# Patient Record
Sex: Male | Born: 1990 | Race: Black or African American | Hispanic: No | Marital: Single | Smoking: Current every day smoker
Health system: Southern US, Community
[De-identification: ages and names within clinical notes are randomized; demographics above are authoritative.]

## PROBLEM LIST (undated history)

## (undated) DIAGNOSIS — K219 Gastro-esophageal reflux disease without esophagitis: Secondary | ICD-10-CM

## (undated) DIAGNOSIS — F79 Unspecified intellectual disabilities: Secondary | ICD-10-CM

## (undated) DIAGNOSIS — E7800 Pure hypercholesterolemia, unspecified: Secondary | ICD-10-CM

## (undated) DIAGNOSIS — E785 Hyperlipidemia, unspecified: Secondary | ICD-10-CM

## (undated) DIAGNOSIS — M1990 Unspecified osteoarthritis, unspecified site: Secondary | ICD-10-CM

## (undated) HISTORY — DX: Unspecified intellectual disabilities: F79

---

## 2013-08-22 ENCOUNTER — Emergency Department (HOSPITAL_BASED_OUTPATIENT_CLINIC_OR_DEPARTMENT_OTHER)
Admission: EM | Admit: 2013-08-22 | Discharge: 2013-08-22 | Disposition: A | Discharge status: Home / Self Care | Payer: Medicaid Other | Attending: Emergency Medicine | Admitting: Emergency Medicine

## 2013-08-22 ENCOUNTER — Encounter (HOSPITAL_BASED_OUTPATIENT_CLINIC_OR_DEPARTMENT_OTHER): Payer: Self-pay | Admitting: Emergency Medicine

## 2013-08-22 DIAGNOSIS — J40 Bronchitis, not specified as acute or chronic: Secondary | ICD-10-CM | POA: Insufficient documentation

## 2013-08-22 DIAGNOSIS — K2100 Gastro-esophageal reflux disease with esophagitis, without bleeding: Secondary | ICD-10-CM | POA: Insufficient documentation

## 2013-08-22 DIAGNOSIS — E7800 Pure hypercholesterolemia, unspecified: Secondary | ICD-10-CM | POA: Insufficient documentation

## 2013-08-22 DIAGNOSIS — Z79899 Other long term (current) drug therapy: Secondary | ICD-10-CM | POA: Insufficient documentation

## 2013-08-22 DIAGNOSIS — F17200 Nicotine dependence, unspecified, uncomplicated: Secondary | ICD-10-CM | POA: Insufficient documentation

## 2013-08-22 DIAGNOSIS — R0789 Other chest pain: Secondary | ICD-10-CM | POA: Insufficient documentation

## 2013-08-22 DIAGNOSIS — F411 Generalized anxiety disorder: Secondary | ICD-10-CM | POA: Insufficient documentation

## 2013-08-22 DIAGNOSIS — R079 Chest pain, unspecified: Secondary | ICD-10-CM

## 2013-08-22 HISTORY — DX: Pure hypercholesterolemia, unspecified: E78.00

## 2013-08-22 LAB — BASIC METABOLIC PANEL
BUN: 5 mg/dL — ABNORMAL LOW (ref 6–23)
CALCIUM: 9.8 mg/dL (ref 8.4–10.5)
CHLORIDE: 103 meq/L (ref 96–112)
CO2: 26 meq/L (ref 19–32)
Creatinine, Ser: 0.6 mg/dL (ref 0.50–1.35)
GFR calc Af Amer: >90 mL/min (ref >90)
GFR calc non Af Amer: >90 mL/min (ref >90)
GLUCOSE: 103 mg/dL — AB (ref 70–99)
Potassium: 4.2 mEq/L (ref 3.7–5.3)
SODIUM: 140 meq/L (ref 137–147)

## 2013-08-22 LAB — TROPONIN I: Troponin I: <0.3 ng/mL (ref <0.30)

## 2013-08-22 MED ORDER — OMEPRAZOLE 20 MG PO CPDR: 20.0000 mg | DELAYED_RELEASE_CAPSULE | Freq: Every day | ORAL | Status: DC

## 2013-08-22 NOTE — ED Notes (Signed)
Chest pain x 1 week. He was seen while in [place] last week and started on unknown medication for a lung infection. Finished his medication 3 days ago.

## 2013-08-22 NOTE — Discharge Instructions (Signed)
Bronchitis Bronchitis is swelling (inflammation) of the air tubes leading to your lungs (bronchi). This causes mucus and a cough. If the swelling gets bad, you may have trouble breathing. HOME CARE   Rest. Chest Pain (Nonspecific) It is often hard to give a specific diagnosis for the cause of chest pain. There is always a chance that your pain could be related to something serious, such as a heart attack or a blood clot in the lungs. You need to follow up with your caregiver for further evaluation. CAUSES  Heartburn. Pneumonia or bronchitis. Anxiety or stress. Inflammation around your heart (pericarditis) or lung (pleuritis or pleurisy). A blood clot in the lung. A collapsed lung (pneumothorax). It can develop suddenly on its own (spontaneous pneumothorax) or from injury (trauma) to the chest. Shingles infection (herpes zoster virus). The chest wall is composed of bones, muscles, and cartilage. Any of these can be the source of the pain. The bones can be bruised by injury. The muscles or cartilage can be strained by coughing or overwork. The cartilage can be affected by inflammation and become sore (costochondritis). DIAGNOSIS  Lab tests or other studies, such as X-rays, electrocardiography, stress testing, or cardiac imaging, may be needed to find the cause of your pain.  TREATMENT  Treatment depends on what may be causing your chest pain. Treatment may include: Acid blockers for heartburn. Anti-inflammatory medicine. Pain medicine for inflammatory conditions. Antibiotics if an infection is present. You may be advised to change lifestyle habits. This includes stopping smoking and avoiding alcohol, caffeine, and chocolate. You may be advised to keep your head raised (elevated) when sleeping. This reduces the chance of acid going backward from your stomach into your esophagus. Most of the time, nonspecific chest pain will improve within 2 to 3 days with rest and mild pain medicine. HOME  CARE INSTRUCTIONS  If antibiotics were prescribed, take your antibiotics as directed. Finish them even if you start to feel better. For the next few days, avoid physical activities that bring on chest pain. Continue physical activities as directed. Do not smoke. Avoid drinking alcohol. Only take over-the-counter or prescription medicine for pain, discomfort, or fever as directed by your caregiver. Follow your caregiver's suggestions for further testing if your chest pain does not go away. Keep any follow-up appointments you made. If you do not go to an appointment, you could develop lasting (chronic) problems with pain. If there is any problem keeping an appointment, you must call to reschedule. SEEK MEDICAL CARE IF:  You think you are having problems from the medicine you are taking. Read your medicine instructions carefully. Your chest pain does not go away, even after treatment. You develop a rash with blisters on your chest. SEEK IMMEDIATE MEDICAL CARE IF:  You have increased chest pain or pain that spreads to your arm, neck, jaw, back, or abdomen. You develop shortness of breath, an increasing cough, or you are coughing up blood. You have severe back or abdominal pain, feel nauseous, or vomit. You develop severe weakness, fainting, or chills. You have a fever. THIS IS AN EMERGENCY. Do not wait to see if the pain will go away. Get medical help at once. Call your local emergency services (911 in [place] Do not drive yourself to the hospital. MAKE SURE YOU:  Understand these instructions. Will watch your condition. Will get help right away if you are not doing well or get worse. Document Released: 04/16/2005 Document Revised: 09/29/2011 Document Reviewed: 02/10/2008 [facility] Patient Information 2014 [place], [place].  Diet for Gastroesophageal Reflux Disease, Adult Reflux is when stomach acid flows up into the esophagus. The esophagus becomes irritated and sore (inflammation). When reflux  happens often and is severe, it is called gastroesophageal reflux disease (GERD). What you eat can help ease any discomfort caused by GERD. FOODS OR DRINKS TO AVOID OR LIMIT Coffee and black tea, with or without caffeine. Bubbly (carbonated) drinks with caffeine or energy drinks. Strong spices, such as pepper, cayenne pepper, curry, or chili powder. Peppermint or spearmint. Chocolate. High-fat foods, such as meats, fried food, oils, butter, or nuts. Fruits and vegetables that cause discomfort. This includes citrus fruits and tomatoes. Alcohol. If a certain food or drink irritates your GERD, avoid eating or drinking it. THINGS THAT MAY HELP GERD INCLUDE: Eat meals slowly. Eat 5 to 6 small meals a day, not 3 large meals. Do not eat food for a certain amount of time if it causes discomfort. Wait 3 hours after eating before lying down. Keep the head of your bed raised 6 to 9 inches (15 23 centimeters). Put a foam wedge or blocks under the legs of the bed. Stay active. Weight loss, if needed, may help ease your discomfort. Wear loose-fitting clothing. Do not smoke or chew tobacco. Document Released: 01/06/2012 Document Reviewed: 01/06/2012 [facility] Patient Information 2014 [place].   Drink enough fluids to keep your pee (urine) clear or pale yellow (unless you have a condition where you have to watch how much you drink).  Only take medicine as told by your doctor. If you were given antibiotic medicines, finish them even if you start to feel better.  Avoid smoke, irritating chemicals, and strong smells. These make the problem worse. Quit smoking if you smoke. This helps your lungs heal faster.  Use a cool mist humidifier. Change the water in the humidifier every day. You can also sit in the bathroom with hot shower running for 5 10 minutes. Keep the door closed.  See your health care provider as told.  Wash your hands often. GET HELP IF: Your problems do not get better after 1  week. GET HELP RIGHT AWAY IF:   Your fever gets worse.  You have chills.  Your chest hurts.  Your problems breathing get worse.  You have blood in your mucus.  You pass out (faint).  You feel lightheaded.  You have a bad headache.  You throw up (vomit) again and again. MAKE SURE YOU:  Understand these instructions.  Will watch your condition.  Will get help right away if you are not doing well or get worse. Document Released: 12/24/2007 Document Revised: 04/27/2013 Document Reviewed: 03/01/2013 [facility] Patient Information 2014 [place], [place].

## 2013-08-22 NOTE — ED Provider Notes (Signed)
CSN: [id number]     Arrival date & time 08/22/13  1344 History   First MD Initiated Contact with Patient 08/22/13 1358     Chief Complaint  Patient presents with  . Chest Pain   HPI Patient presents with her week to 2 week history of chest discomfort.  Was seen at a hospital in [place] all last week and diagnosed with bronchitis.  Was given a prescription for azithromycin and an albuterol inhaler.  Patient had been smoking but stopped after his visit the hospital.  Mother says had some anxiety because of chest discomfort.  Patient has no known cardiac history.  He does have high cholesterol and takes medication for that. Past Medical History  Diagnosis Date  . High cholesterol    History reviewed. No pertinent past surgical history. History reviewed. No pertinent family history. History  Substance Use Topics  . Smoking status: Current Every Day Smoker -- 1.00 packs/day    Types: Cigarettes  . Smokeless tobacco: Not on file  . Alcohol Use: Yes    Review of Systems All other systems reviewed and are negative  Allergies  Review of patient's allergies indicates no known allergies.  Home Medications   Current Outpatient Rx  Name  Route  Sig  Dispense  Refill  . Cetirizine HCl (ZYRTEC PO)   Oral   Take by mouth.         Marland Kitchen SIMVASTATIN PO   Oral   Take by mouth.         Marland Kitchen omeprazole (PRILOSEC) 20 MG capsule   Oral   Take 1 capsule (20 mg total) by mouth daily.   30 capsule   0    BP 145/86  Pulse 83  Temp(Src) 98.9 F (37.2 C) (Oral)  Resp 18  Ht 5\' 6"  (1.676 m)  Wt 200 lb (90.719 kg)  BMI 32.30 kg/m2  SpO2 100% Physical Exam  Nursing note and vitals reviewed. Constitutional: He is oriented to person, place, and time. He appears well-developed and well-nourished. No distress.  HENT:  Head: Normocephalic and atraumatic.  Eyes: Pupils are equal, round, and reactive to light.  Neck: Normal range of motion.  Cardiovascular: Normal rate, normal heart sounds and  intact distal pulses.   Pulmonary/Chest: No respiratory distress.  Abdominal: Normal appearance. He exhibits no distension.  Musculoskeletal: Normal range of motion.  Neurological: He is alert and oriented to person, place, and time. No cranial nerve deficit.  Skin: Skin is warm and dry. No rash noted.  Psychiatric: He has a normal mood and affect. His behavior is normal.    ED Course  Procedures (including critical care time) Labs Review Labs Reviewed  TROPONIN I  BASIC METABOLIC PANEL   Imaging Review No results found.  EKG Interpretation    Date/Time:  Monday August 22 2013 13:52:16 EST Ventricular Rate:  76 PR Interval:  138 QRS Duration: 78 QT Interval:  334 QTC Calculation: 375 R Axis:   63 Text Interpretation:  Normal sinus rhythm Normal ECG  No previous tracing Confirmed by Farrie Sann  MD, Kavish Lafitte ([id number]) on 08/22/2013 1:58:28 PM           Reviewing his medical records from his visit a few days ago in [place] reveals a normal chest x-ray.  Negative troponin and negative CPK.  He was discharged with a diagnosis of bronchitis and placed on azithromycin and albuterol inhaler.  Patient also admits that he has reflux esophagitis but does not take his medication on a regular  basis. MDM   1. Chest pain   2. Bronchitis   3. Reflux esophagitis         Nelia Shiobert L Joslyn Ramos, MD 08/22/13 [phone]

## 2015-02-24 ENCOUNTER — Emergency Department (HOSPITAL_BASED_OUTPATIENT_CLINIC_OR_DEPARTMENT_OTHER)
Admission: EM | Admit: 2015-02-24 | Discharge: 2015-02-24 | Disposition: A | Discharge status: Home / Self Care | Payer: Medicaid Other | Attending: Emergency Medicine | Admitting: Emergency Medicine

## 2015-02-24 ENCOUNTER — Encounter (HOSPITAL_BASED_OUTPATIENT_CLINIC_OR_DEPARTMENT_OTHER): Payer: Self-pay | Admitting: Emergency Medicine

## 2015-02-24 ENCOUNTER — Emergency Department (HOSPITAL_BASED_OUTPATIENT_CLINIC_OR_DEPARTMENT_OTHER): Payer: Medicaid Other

## 2015-02-24 DIAGNOSIS — R0600 Dyspnea, unspecified: Secondary | ICD-10-CM | POA: Diagnosis not present

## 2015-02-24 DIAGNOSIS — E780 Pure hypercholesterolemia: Secondary | ICD-10-CM | POA: Diagnosis not present

## 2015-02-24 DIAGNOSIS — K219 Gastro-esophageal reflux disease without esophagitis: Secondary | ICD-10-CM | POA: Diagnosis not present

## 2015-02-24 DIAGNOSIS — R0602 Shortness of breath: Secondary | ICD-10-CM | POA: Diagnosis present

## 2015-02-24 DIAGNOSIS — Z79899 Other long term (current) drug therapy: Secondary | ICD-10-CM | POA: Diagnosis not present

## 2015-02-24 DIAGNOSIS — Z720 Tobacco use: Secondary | ICD-10-CM | POA: Insufficient documentation

## 2015-02-24 DIAGNOSIS — E785 Hyperlipidemia, unspecified: Secondary | ICD-10-CM | POA: Insufficient documentation

## 2015-02-24 DIAGNOSIS — J029 Acute pharyngitis, unspecified: Secondary | ICD-10-CM | POA: Diagnosis not present

## 2015-02-24 DIAGNOSIS — Z8739 Personal history of other diseases of the musculoskeletal system and connective tissue: Secondary | ICD-10-CM | POA: Diagnosis not present

## 2015-02-24 HISTORY — DX: Hyperlipidemia, unspecified: E78.5

## 2015-02-24 HISTORY — DX: Gastro-esophageal reflux disease without esophagitis: K21.9

## 2015-02-24 HISTORY — DX: Unspecified osteoarthritis, unspecified site: M19.90

## 2015-02-24 LAB — RAPID STREP SCREEN (MED CTR MEBANE ONLY): Streptococcus, Group A Screen (Direct): NEGATIVE

## 2015-02-24 MED ORDER — PREDNISONE 10 MG PO TABS: 20.0000 mg | ORAL_TABLET | Freq: Two times a day (BID) | ORAL | Status: DC

## 2015-02-24 MED ORDER — ALBUTEROL SULFATE (2.5 MG/3ML) 0.083% IN NEBU
INHALATION_SOLUTION | RESPIRATORY_TRACT | Status: AC
  Administered 2015-02-24: 5 mg via RESPIRATORY_TRACT
  Filled 2015-02-24: qty 6

## 2015-02-24 MED ORDER — ALBUTEROL SULFATE HFA 108 (90 BASE) MCG/ACT IN AERS
2.0000 | INHALATION_SPRAY | RESPIRATORY_TRACT | Status: DC | PRN
  Administered 2015-02-24: 2 via RESPIRATORY_TRACT

## 2015-02-24 MED ORDER — ALBUTEROL SULFATE (2.5 MG/3ML) 0.083% IN NEBU
5.0000 mg | INHALATION_SOLUTION | Freq: Once | RESPIRATORY_TRACT | Status: AC
  Administered 2015-02-24: 5 mg via RESPIRATORY_TRACT

## 2015-02-24 MED ORDER — ALBUTEROL SULFATE HFA 108 (90 BASE) MCG/ACT IN AERS
INHALATION_SPRAY | RESPIRATORY_TRACT | Status: AC
  Filled 2015-02-24: qty 6.7

## 2015-02-24 NOTE — Discharge Instructions (Signed)
Albuterol inhaler: 2 puffs every 4 hours as needed for wheezing.  Prednisone as prescribed.  Follow your primary Dr. if not improving in the next week.   Shortness of Breath Shortness of breath means you have trouble breathing. It could also mean that you have a medical problem. You should get immediate medical care for shortness of breath. CAUSES   Not enough oxygen in the air such as with high altitudes or a smoke-filled room.  Certain lung diseases, infections, or problems.  Heart disease or conditions, such as angina or heart failure.  Low red blood cells (anemia).  Poor physical fitness, which can cause shortness of breath when you exercise.  Chest or back injuries or stiffness.  Being overweight.  Smoking.  Anxiety, which can make you feel like you are not getting enough air. DIAGNOSIS  Serious medical problems can often be found during your physical exam. Tests may also be done to determine why you are having shortness of breath. Tests may include:  Chest X-rays.  Lung function tests.  Blood tests.  An electrocardiogram (ECG).  An ambulatory electrocardiogram. An ambulatory ECG records your heartbeat patterns over a 24-hour period.  Exercise testing.  A transthoracic echocardiogram (TTE). During echocardiography, sound waves are used to evaluate how blood flows through your heart.  A transesophageal echocardiogram (TEE).  Imaging scans. Your health care provider may not be able to find a cause for your shortness of breath after your exam. In this case, it is important to have a follow-up exam with your health care provider as directed.  TREATMENT  Treatment for shortness of breath depends on the cause of your symptoms and can vary greatly. HOME CARE INSTRUCTIONS   Do not smoke. Smoking is a common cause of shortness of breath. If you smoke, ask for help to quit.  Avoid being around chemicals or things that may bother your breathing, such as paint fumes  and dust.  Rest as needed. Slowly resume your usual activities.  If medicines were prescribed, take them as directed for the full length of time directed. This includes oxygen and any inhaled medicines.  Keep all follow-up appointments as directed by your health care provider. SEEK MEDICAL CARE IF:   Your condition does not improve in the time expected.  You have a hard time doing your normal activities even with rest.  You have any new symptoms. SEEK IMMEDIATE MEDICAL CARE IF:   Your shortness of breath gets worse.  You feel light-headed, faint, or develop a cough not controlled with medicines.  You start coughing up blood.  You have pain with breathing.  You have chest pain or pain in your arms, shoulders, or abdomen.  You have a fever.  You are unable to walk up stairs or exercise the way you normally do. MAKE SURE YOU:  Understand these instructions.  Will watch your condition.  Will get help right away if you are not doing well or get worse. Document Released: 04/01/2001 Document Revised: 07/12/2013 Document Reviewed: 09/22/2011 [facility] Patient Information 2015 [place], [place]. This information is not intended to replace advice given to you by your health care provider. Make sure you discuss any questions you have with your health care provider.  Pharyngitis Pharyngitis is redness, pain, and swelling (inflammation) of your pharynx.  CAUSES  Pharyngitis is usually caused by infection. Most of the time, these infections are from viruses (viral) and are part of a cold. However, sometimes pharyngitis is caused by bacteria (bacterial). Pharyngitis can also be  caused by allergies. Viral pharyngitis may be spread from person to person by coughing, sneezing, and personal items or utensils (cups, forks, spoons, toothbrushes). Bacterial pharyngitis may be spread from person to person by more intimate contact, such as kissing.  SIGNS AND SYMPTOMS  Symptoms of pharyngitis  include:   Sore throat.   Tiredness (fatigue).   Low-grade fever.   Headache.  Joint pain and muscle aches.  Skin rashes.  Swollen lymph nodes.  Plaque-like film on throat or tonsils (often seen with bacterial pharyngitis). DIAGNOSIS  Your health care provider will ask you questions about your illness and your symptoms. Your medical history, along with a physical exam, is often all that is needed to diagnose pharyngitis. Sometimes, a rapid strep test is done. Other lab tests may also be done, depending on the suspected cause.  TREATMENT  Viral pharyngitis will usually get better in 3-4 days without the use of medicine. Bacterial pharyngitis is treated with medicines that kill germs (antibiotics).  HOME CARE INSTRUCTIONS   Drink enough water and fluids to keep your urine clear or pale yellow.   Only take over-the-counter or prescription medicines as directed by your health care provider:   If you are prescribed antibiotics, make sure you finish them even if you start to feel better.   Do not take aspirin.   Get lots of rest.   Gargle with 8 oz of salt water ( tsp of salt per 1 qt of water) as often as every 1-2 hours to soothe your throat.   Throat lozenges (if you are not at risk for choking) or sprays may be used to soothe your throat. SEEK MEDICAL CARE IF:   You have large, tender lumps in your neck.  You have a rash.  You cough up green, yellow-brown, or bloody spit. SEEK IMMEDIATE MEDICAL CARE IF:   Your neck becomes stiff.  You drool or are unable to swallow liquids.  You vomit or are unable to keep medicines or liquids down.  You have severe pain that does not go away with the use of recommended medicines.  You have trouble breathing (not caused by a stuffy nose). MAKE SURE YOU:   Understand these instructions.  Will watch your condition.  Will get help right away if you are not doing well or get worse. Document Released: 07/07/2005  Document Revised: 04/27/2013 Document Reviewed: 03/14/2013 [facility] Patient Information 2015 [place], [place]. This information is not intended to replace advice given to you by your health care provider. Make sure you discuss any questions you have with your health care provider.

## 2015-02-24 NOTE — ED Notes (Signed)
Pt reports SOB and throat tightness also states he has mold growing in his bed room

## 2015-02-24 NOTE — ED Provider Notes (Signed)
CSN: [id number]     Arrival date & time 02/24/15  0519 History   First MD Initiated Contact with Patient 02/24/15 0539     Chief Complaint  Patient presents with  . Shortness of Breath     (Consider location/radiation/quality/duration/timing/severity/associated sxs/prior Treatment) HPI Comments: Patient is a 24 year old male who presents with complaints of scratchy throat, difficulty breathing, wheezing. This started yesterday and is worsening. He has a history of asthma and also reports that he has mold growing in his room along the base boards and box spring.  Patient is a 24 y.o. male presenting with shortness of breath. The history is provided by the patient.  Shortness of Breath Severity:  Moderate Onset quality:  Sudden Duration:  1 day Timing:  Constant Progression:  Worsening Chronicity:  New Relieved by:  Nothing Worsened by:  Nothing tried Ineffective treatments:  None tried   Past Medical History  Diagnosis Date  . High cholesterol   . GERD (gastroesophageal reflux disease)   . Arthritis   . Hyperlipemia    History reviewed. No pertinent past surgical history. History reviewed. No pertinent family history. History  Substance Use Topics  . Smoking status: Current Every Day Smoker -- 1.00 packs/day    Types: Cigarettes  . Smokeless tobacco: Not on file  . Alcohol Use: Yes    Review of Systems  Respiratory: Positive for shortness of breath.   All other systems reviewed and are negative.     Allergies  Review of patient's allergies indicates no known allergies.  Home Medications   Prior to Admission medications   Medication Sig Start Date End Date Taking? Authorizing Provider  Cetirizine HCl (ZYRTEC PO) Take by mouth.    Historical Provider, MD  omeprazole (PRILOSEC) 20 MG capsule Take 1 capsule (20 mg total) by mouth daily. 08/22/13   Nelva Nay, MD  SIMVASTATIN PO Take by mouth.    Historical Provider, MD   BP 133/64 mmHg  Pulse 86  Temp(Src)  98.8 F (37.1 C) (Oral)  Resp 20  Ht  (1.676 m)  Wt 200 lb (90.719 kg)  BMI 32.30 kg/m2  SpO2 100% Physical Exam  Constitutional: He is oriented to person, place, and time. He appears well-developed and well-nourished. No distress.  HENT:  Head: Normocephalic and atraumatic.  Mouth/Throat: Oropharynx is clear and moist.  Neck: Normal range of motion. Neck supple.  Cardiovascular: Normal rate, regular rhythm and normal heart sounds.   No murmur heard. Pulmonary/Chest: Effort normal and breath sounds normal. No respiratory distress. He has no wheezes.  Abdominal: Soft. Bowel sounds are normal. He exhibits no distension. There is no tenderness.  Musculoskeletal: Normal range of motion. He exhibits no edema.  Lymphadenopathy:    He has no cervical adenopathy.  Neurological: He is alert and oriented to person, place, and time.  Skin: Skin is warm and dry. He is not diaphoretic.  Nursing note and vitals reviewed.   ED Course  Procedures (including critical care time) Labs Review Labs Reviewed  RAPID STREP SCREEN (NOT AT [facility])    Imaging Review No results found.   EKG Interpretation None      MDM   Final diagnoses:  None    Chest x-ray is negative and patient is feeling somewhat better after an albuterol treatment. He will be discharged to home with prednisone, and MDI, and when necessary return.    Geoffery Lyons, MD 02/24/15 [phone]

## 2015-02-26 LAB — CULTURE, GROUP A STREP: Strep A Culture: NEGATIVE

## 2016-08-12 IMAGING — DX DG CHEST 2V
2 series · 2 of 2 positions shown · non-contrast
Comparison: None.

CLINICAL DATA: Dyspnea in throat tightness, 1 day duration.

EXAM:
CHEST  2 VIEW

[chest pa]
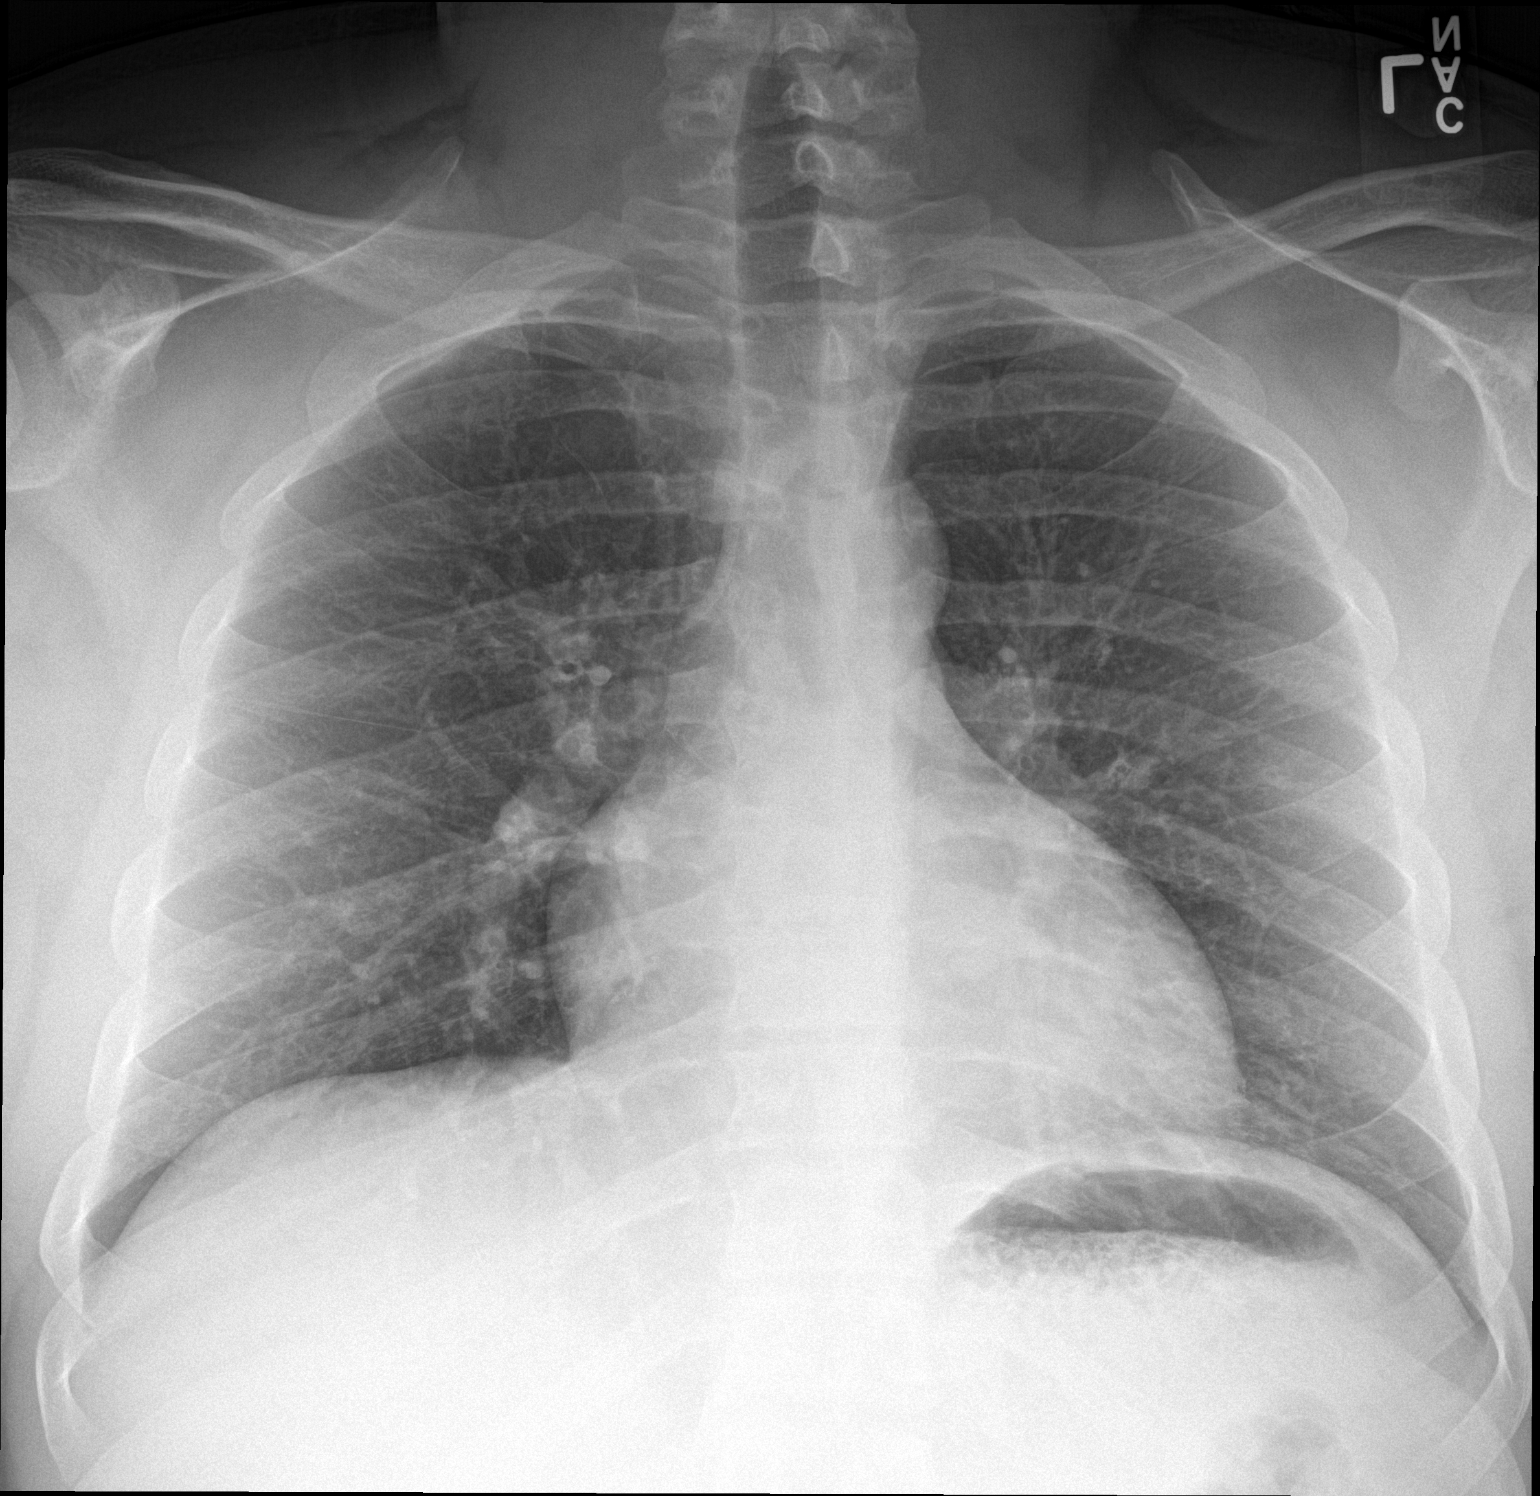

[chest lat]
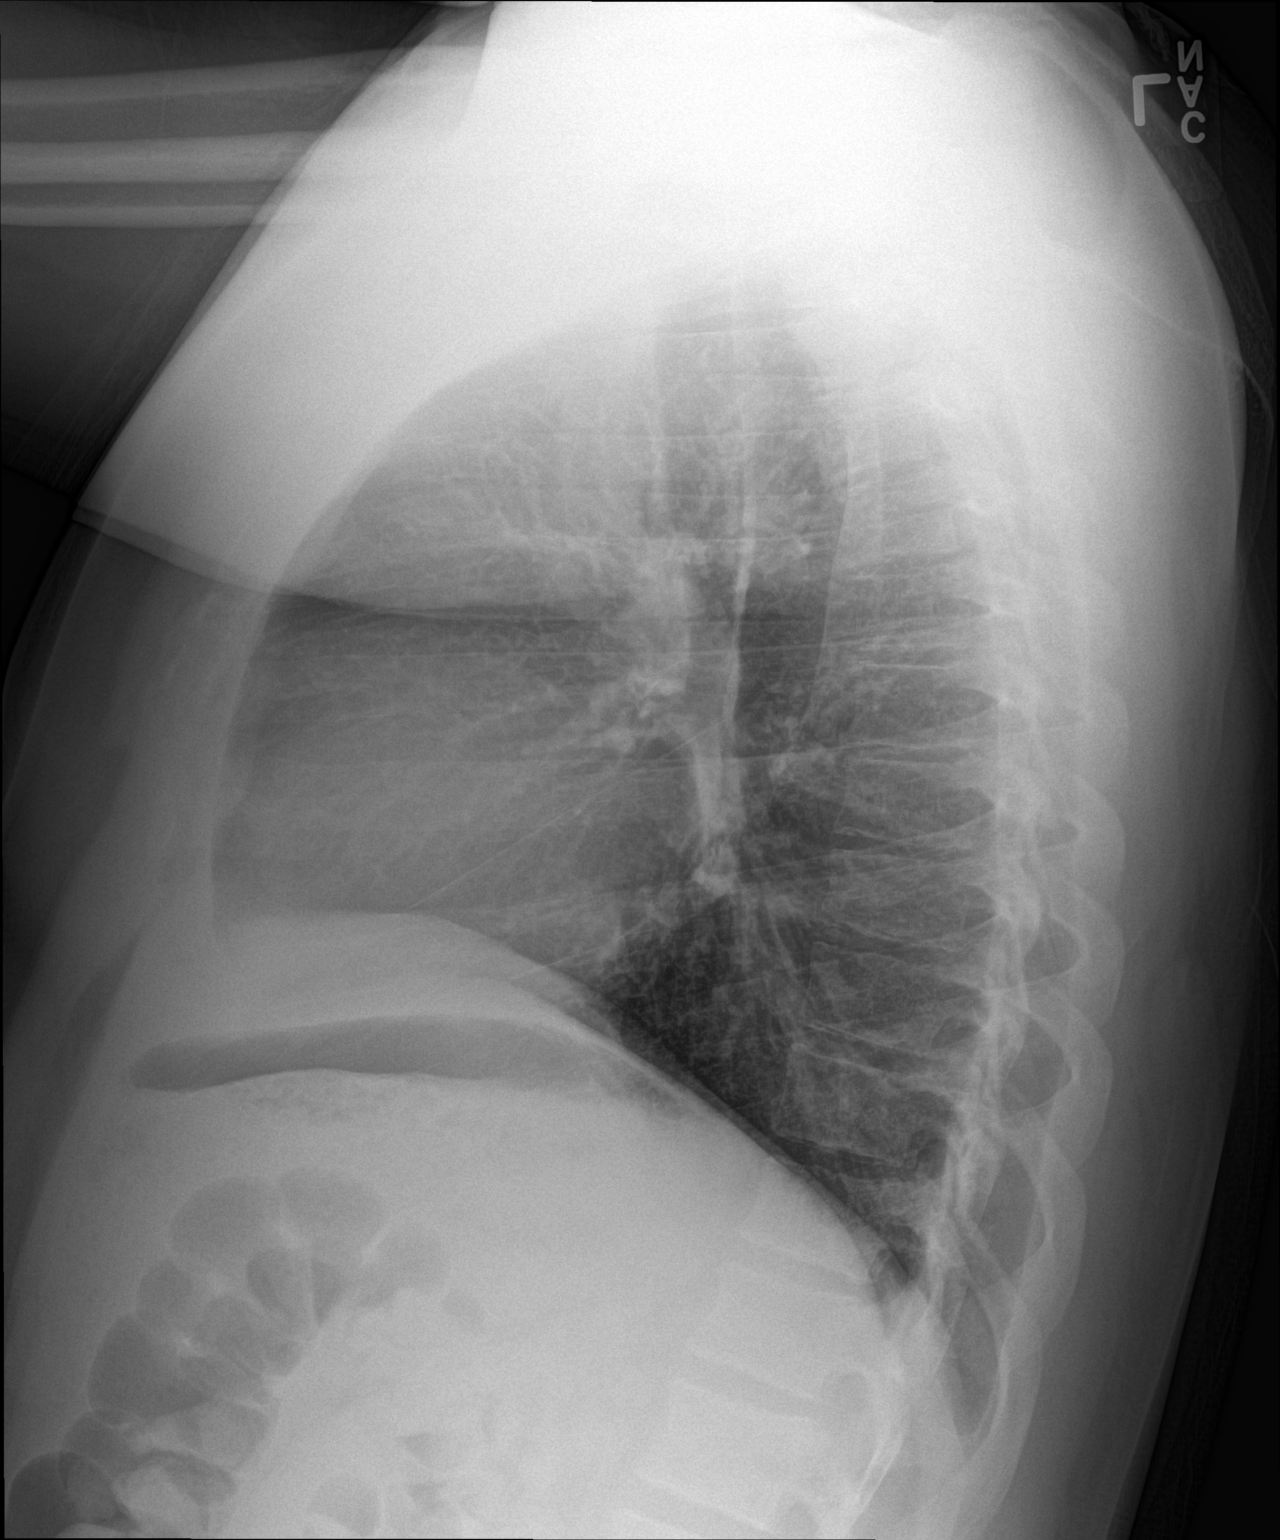

[2 of 2 positions shown; findings below may reference images not displayed]

FINDINGS: The heart size and mediastinal contours are within normal limits.
Both lungs are clear. The visualized skeletal structures are
unremarkable.
IMPRESSION: No active cardiopulmonary disease.

## 2022-03-05 ENCOUNTER — Emergency Department (HOSPITAL_BASED_OUTPATIENT_CLINIC_OR_DEPARTMENT_OTHER)
Admission: EM | Admit: 2022-03-05 | Discharge: 2022-03-05 | Disposition: A | Discharge status: Home / Self Care | Payer: Medicaid Other | Attending: Emergency Medicine | Admitting: Emergency Medicine

## 2022-03-05 ENCOUNTER — Encounter (HOSPITAL_BASED_OUTPATIENT_CLINIC_OR_DEPARTMENT_OTHER): Payer: Self-pay | Admitting: Emergency Medicine

## 2022-03-05 DIAGNOSIS — T63481A Toxic effect of venom of other arthropod, accidental (unintentional), initial encounter: Secondary | ICD-10-CM | POA: Diagnosis present

## 2022-03-05 DIAGNOSIS — T63484A Toxic effect of venom of other arthropod, undetermined, initial encounter: Secondary | ICD-10-CM

## 2022-03-05 MED ORDER — DIPHENHYDRAMINE HCL 25 MG PO TABS: 25.0000 mg | ORAL_TABLET | Freq: Four times a day (QID) | ORAL | 0 refills | Status: AC | PRN

## 2022-03-05 MED ORDER — PREDNISONE 50 MG PO TABS
60.0000 mg | ORAL_TABLET | Freq: Once | ORAL | Status: AC
  Administered 2022-03-05: 60 mg via ORAL
  Filled 2022-03-05: qty 1

## 2022-03-05 MED ORDER — PREDNISONE 10 MG PO TABS: 40.0000 mg | ORAL_TABLET | Freq: Every day | ORAL | 0 refills | Status: AC

## 2022-03-05 MED ORDER — DIPHENHYDRAMINE HCL 50 MG/ML IJ SOLN
50.0000 mg | Freq: Once | INTRAMUSCULAR | Status: AC
  Administered 2022-03-05: 50 mg via INTRAMUSCULAR
  Filled 2022-03-05: qty 1

## 2022-03-05 MED ORDER — DIPHENHYDRAMINE HCL 50 MG/ML IJ SOLN: 50.0000 mg | Freq: Once | INTRAMUSCULAR | Status: DC

## 2022-03-05 NOTE — ED Triage Notes (Signed)
Pt with significant redness and swelling to LUE today; mother sts it was a small round area yesterday

## 2022-03-05 NOTE — ED Notes (Signed)
ED Provider at bedside. 

## 2022-03-05 NOTE — Discharge Instructions (Signed)
Take the steroids and Benadryl as prescribed.  Make sure you take scheduled Benadryl for the next 2 to 3 days.  Come back if any shortness of breath, wheezing, vomiting, spreading of the redness or swelling, loss of sensation or weakness in the hand or any other symptoms concerning to you.

## 2022-03-05 NOTE — ED Provider Notes (Signed)
[facility] EMERGENCY DEPARTMENT Provider Note   CSN: [id number] Arrival date & time: 03/05/22  6606     History  Chief Complaint  Patient presents with   Arm Swelling    Jose Branch is a 31 y.o. male.  With PMH of HLD, GERD, intellectual disability presenting with arm redness and swelling.  Patient was out side yesterday on the family's lawn walking over to an old car that he typically sits and when he saw an insect fly over and visualized that stinging him on the left wrist near his palm.  He was trying to get the stinger out but does not know if he got it out.  He denies any animal bite, snake bite, tick bite or spider bite.  He is positive that it was something flying and mom is concerned that it is a large hornet that has been flying around that area.  He has had redness and swelling that has progressed from the area of the sting up to his mid humeral region.  He says it is itchy, swollen and red but not painful.  He denies hitting his arm on anything or hurting it against anything.  No fevers, no chills, no other infectious symptoms.  No shortness of breath or wheezing, no vomiting.  HPI     Home Medications Prior to Admission medications   Medication Sig Start Date End Date Taking? Authorizing Provider  diphenhydrAMINE (BENADRYL) 25 MG tablet Take 1 tablet (25 mg total) by mouth every 6 (six) hours as needed. 03/05/22  Yes Mardene Sayer, MD  predniSONE (DELTASONE) 10 MG tablet Take 4 tablets (40 mg total) by mouth daily for 5 days. 03/05/22 03/10/22 Yes Mardene Sayer, MD  Cetirizine HCl (ZYRTEC PO) Take by mouth.    [provider]  omeprazole (PRILOSEC) 20 MG capsule Take 1 capsule (20 mg total) by mouth daily. 08/22/13   Nelva Nay, MD  predniSONE (DELTASONE) 10 MG tablet Take 2 tablets (20 mg total) by mouth 2 (two) times daily. 02/24/15   Geoffery Lyons, MD  SIMVASTATIN PO Take by mouth.    [provider]      Allergies    Patient  has no known allergies.    Review of Systems   Review of Systems  Physical Exam Updated Vital Signs BP 125/78   Pulse (!) 56   Temp 97.9 F (36.6 C) (Oral)   Resp 16   Ht 5\' 6"  (1.676 m)   SpO2 100%   BMI 32.28 kg/m  Physical Exam Constitutional: Alert and oriented. Well appearing and in no distress. Eyes: Conjunctivae are normal. ENT      Head: Normocephalic and atraumatic.      Nose: No congestion.      Mouth/Throat: Mucous membranes are moist.      Neck: No stridor. Cardiovascular: S1, S2,  Normal and symmetric distal pulses are present in all extremities.Warm and well perfused. Respiratory: Normal respiratory effort. Breath sounds are normal.  No wheezing.  Oxygen saturation 100% on room air. Gastrointestinal: Soft and nontender.  Musculoskeletal: Normal range of motion in all extremities. Left upper extremity erythema extending from left wrist region up to left mid humeral region as documented in imaging with associated nonpitting edema, nontender, blanching erythema, no bony tenderness, full shoulder, elbow and hand grip strength with sensation intact, left upper extremity neurovascularly intact Neurologic: Normal speech and language. No gross focal neurologic deficits are appreciated. Skin: Skin is warm, dry and intact. No rash noted.  Psychiatric: Mood and affect are normal. Speech and behavior are normal.       ED Results / Procedures / Treatments   Labs (all labs ordered are listed, but only abnormal results are displayed) Labs Reviewed - No data to display  EKG None  Radiology No results found.  Procedures Procedures    Medications Ordered in ED Medications  predniSONE (DELTASONE) tablet 60 mg (60 mg Oral Given 03/05/22 1025)  diphenhydrAMINE (BENADRYL) injection 50 mg (50 mg Intramuscular Given 03/05/22 1025)    ED Course/ Medical Decision Making/ A&P                           Medical Decision Making Leonardo Makris is a 31 y.o. male.  With PMH  of HLD, GERD, intellectual disability presenting with arm redness and swelling.    Patient persistent that this occurred after an insect sting that was likely of wasp in the region.  Denies any snakebite.  His left arm is neurovascularly intact.  Denies any injury and has no bony tenderness, no concern for underlying fracture or dislocation.  Compartments are soft, not concern for compartment syndrome with no pain and no neurovascular deficits.  He has no fever or other infectious symptoms, unlikely cellulitis.  He has erythema and swelling extending from his wrist to the mid humerus as seen in media which I suspect is likely inflammatory and allergic secondary to insect sting.  He has no other signs or symptoms suggestive of anaphylaxis.  We will give him prednisone 60 mg here and IM Benadryl 50 mg and discharged with scheduled Benadryl and prednisone and strict return precautions.  He was observed in the ER for 2 hours with no progression of redness or swelling.  Mother is at bedside and is agreement with plan and he is safe for discharge at this time.  Risk Prescription drug management.    Final Clinical Impression(s) / ED Diagnoses Final diagnoses:  Allergic reaction to insect sting, undetermined intent, initial encounter    Rx / DC Orders ED Discharge Orders          Ordered    predniSONE (DELTASONE) 10 MG tablet  Daily        03/05/22 1147    diphenhydrAMINE (BENADRYL) 25 MG tablet  Every 6 hours PRN        03/05/22 1147              Mardene Sayer, MD 03/05/22 1148

## 2022-10-05 ENCOUNTER — Other Ambulatory Visit: Payer: Self-pay

## 2022-10-05 ENCOUNTER — Emergency Department (HOSPITAL_BASED_OUTPATIENT_CLINIC_OR_DEPARTMENT_OTHER)
Admission: EM | Admit: 2022-10-05 | Discharge: 2022-10-05 | Disposition: A | Discharge status: Home / Self Care | Payer: Medicaid Other | Attending: Emergency Medicine | Admitting: Emergency Medicine

## 2022-10-05 ENCOUNTER — Encounter (HOSPITAL_BASED_OUTPATIENT_CLINIC_OR_DEPARTMENT_OTHER): Payer: Self-pay | Admitting: Emergency Medicine

## 2022-10-05 DIAGNOSIS — L03116 Cellulitis of left lower limb: Secondary | ICD-10-CM | POA: Insufficient documentation

## 2022-10-05 DIAGNOSIS — M79669 Pain in unspecified lower leg: Secondary | ICD-10-CM | POA: Diagnosis present

## 2022-10-05 DIAGNOSIS — L03115 Cellulitis of right lower limb: Secondary | ICD-10-CM | POA: Insufficient documentation

## 2022-10-05 DIAGNOSIS — L03119 Cellulitis of unspecified part of limb: Secondary | ICD-10-CM

## 2022-10-05 DIAGNOSIS — L539 Erythematous condition, unspecified: Secondary | ICD-10-CM | POA: Insufficient documentation

## 2022-10-05 MED ORDER — DIPHENHYDRAMINE HCL 25 MG PO CAPS
50.0000 mg | ORAL_CAPSULE | Freq: Once | ORAL | Status: AC
  Administered 2022-10-05: 50 mg via ORAL
  Filled 2022-10-05: qty 2

## 2022-10-05 MED ORDER — CEPHALEXIN 250 MG PO CAPS
500.0000 mg | ORAL_CAPSULE | Freq: Once | ORAL | Status: AC
  Administered 2022-10-05: 500 mg via ORAL
  Filled 2022-10-05: qty 2

## 2022-10-05 MED ORDER — PREDNISONE 50 MG PO TABS
50.0000 mg | ORAL_TABLET | Freq: Once | ORAL | Status: AC
  Administered 2022-10-05: 50 mg via ORAL
  Filled 2022-10-05: qty 1

## 2022-10-05 MED ORDER — PREDNISONE 50 MG PO TABS: 50.0000 mg | ORAL_TABLET | Freq: Every day | ORAL | 0 refills | Status: AC

## 2022-10-05 MED ORDER — CEPHALEXIN 500 MG PO CAPS: 500.0000 mg | ORAL_CAPSULE | Freq: Two times a day (BID) | ORAL | 0 refills | Status: AC

## 2022-10-05 NOTE — ED Notes (Signed)
Pt. Has redness noted on both lower extremities with a rash and the rash is going up the thigh area of both legs.

## 2022-10-05 NOTE — ED Provider Notes (Addendum)
[facility] Provider Note   CSN: [id number] Arrival date & time: 10/05/22  1244     History  Chief Complaint  Jose Branch presents with   Leg Pain    Jose Branch is a 32 y.o. male.  The history is provided by the Jose Branch and medical records. No language interpreter was used.  Leg Pain Location:  Leg Time since incident:  2 days Injury: no   Leg location:  L lower leg and R lower leg Pain details:    Quality:  Burning and aching   Radiates to:  Does not radiate   Severity:  Moderate   Onset quality:  Gradual   Duration:  2 days   Timing:  Constant   Progression:  Worsening Chronicity:  New Tetanus status:  Unknown Prior injury to area:  No Relieved by:  Nothing Worsened by:  Nothing Ineffective treatments:  None tried Associated symptoms: swelling   Associated symptoms: no back pain, no fatigue, no fever, no muscle weakness and no neck pain        Home Medications Prior to Admission medications   Medication Sig Start Date End Date Taking? Authorizing Provider  Cetirizine HCl (ZYRTEC PO) Take by mouth.    [provider]  diphenhydrAMINE (BENADRYL) 25 MG tablet Take 1 tablet (25 mg total) by mouth every 6 (six) hours as needed. 03/05/22   Elgie Congo, MD  omeprazole (PRILOSEC) 20 MG capsule Take 1 capsule (20 mg total) by mouth daily. 08/22/13   Leonard Schwartz, MD  predniSONE (DELTASONE) 10 MG tablet Take 2 tablets (20 mg total) by mouth 2 (two) times daily. 02/24/15   Veryl Speak, MD  SIMVASTATIN PO Take by mouth.    [provider]      Allergies    Jose Branch has no known allergies.    Review of Systems   Review of Systems  Constitutional:  Negative for chills, fatigue and fever.  HENT:  Negative for congestion.   Respiratory:  Negative for cough, chest tightness, shortness of breath and wheezing.   Cardiovascular:  Positive for leg swelling (mild). Negative for chest pain and palpitations.   Gastrointestinal:  Negative for abdominal pain, constipation, diarrhea, nausea and vomiting.  Genitourinary:  Negative for dysuria, flank pain and frequency.  Musculoskeletal:  Negative for back pain, neck pain and neck stiffness.  Skin:  Positive for rash. Negative for wound.  Neurological:  Negative for dizziness, light-headedness and headaches.  Psychiatric/Behavioral:  Negative for agitation and confusion.   All other systems reviewed and are negative.   Physical Exam Updated Vital Signs BP (!) 139/91   Pulse 63   Temp 97.9 F (36.6 C) (Oral)   Resp 18   Ht 5\' 6"  (1.676 m)   SpO2 100%   BMI 32.28 kg/m  Physical Exam Vitals and nursing note reviewed.  Constitutional:      General: He is not in acute distress.    Appearance: He is well-developed. He is not ill-appearing, toxic-appearing or diaphoretic.  HENT:     Head: Normocephalic and atraumatic.     Nose: No congestion or rhinorrhea.     Mouth/Throat:     Mouth: Mucous membranes are moist.     Pharynx: No oropharyngeal exudate or posterior oropharyngeal erythema.  Eyes:     Extraocular Movements: Extraocular movements intact.     Conjunctiva/sclera: Conjunctivae normal.     Pupils: Pupils are equal, round, and reactive to light.  Cardiovascular:  Rate and Rhythm: Normal rate and regular rhythm.     Heart sounds: No murmur heard. Pulmonary:     Effort: Pulmonary effort is normal. No respiratory distress.     Breath sounds: Normal breath sounds. No wheezing, rhonchi or rales.  Chest:     Chest wall: No tenderness.  Abdominal:     General: Abdomen is flat.     Palpations: Abdomen is soft.     Tenderness: There is no abdominal tenderness. There is no right CVA tenderness, guarding or rebound.  Musculoskeletal:        General: Swelling (mild bilateral shins) and tenderness present.     Cervical back: Neck supple. No tenderness.     Right lower leg: Edema present.     Left lower leg: Edema present.  Skin:     General: Skin is warm and dry.     Capillary Refill: Capillary refill takes less than 2 seconds.     Findings: Erythema and rash present.  Neurological:     General: No focal deficit present.     Mental Status: He is alert.     Sensory: No sensory deficit.     Motor: No weakness.  Psychiatric:        Mood and Affect: Mood normal.     ED Results / Procedures / Treatments   Labs (all labs ordered are listed, but only abnormal results are displayed) Labs Reviewed - No data to display  EKG None  Radiology No results found.  Procedures Procedures    Medications Ordered in ED Medications  cephALEXin (KEFLEX) capsule 500 mg (has no administration in time range)  predniSONE (DELTASONE) tablet 50 mg (has no administration in time range)  diphenhydrAMINE (BENADRYL) capsule 50 mg (has no administration in time range)    ED Course/ Medical Decision Making/ A&P                             Medical Decision Making Risk Prescription drug management.    Jose Branch is a 32 y.o. male with a past medical history significant for hypercholesterolemia, GERD, and arthritis who presents with bilateral leg erythema, burning discomfort, and swelling.  According to Jose Branch, for the last day or 2, Jose Branch has developed discomfort burning and redness on both legs primarily in the shins.  He denies walking through any fields or getting any irritants on his legs.  It spares the ankles and feet.  Jose Branch is denying any fevers, chills, congestion, cough, nausea, vomiting, constipation, diarrhea, or urinary changes.  Denies any recent medication use.  Denies any new allergies or irritants.  Denies any skin injury.  No history of significant cellulitis.  No history of heart failure.  No shortness of breath.  Jose Branch reports he was wearing sweatpants yesterday and did not walk through any tall grass.  Denies any other symptoms.  On exam, Jose Branch has erythema diffusely on both shin areas going towards  knees.  No tenderness in the knees and normal range of motion.  Intact sensation, strength, and pulses distally.  Mild swelling in the shins and lower legs bilaterally but ankles and feet do not appear swollen.  Hips nontender.  Abdomen nontender.  No rash on rest of body.  Lungs clear and chest nontender.  No Rash in mouth.  Given Jose Branch's evaluation this very much looks like contact dermatitis or cellulitis.  With lack of diffuse swelling in the ankles or feet I have low suspicion  that this is new CHF and given the bilateral nature of this and the appearance it does not appear to look like DVTs.  Had a shared decision conversation with Jose Branch.  His vital signs are reassuring and he is only complaining of the burning redness and discomfort in his legs.  Clinical I suspect this is a contact dermatitis of some kind that he does not remember exposing his shins as it seems to observe a pattern that spared wear socks and shoes would be however cannot rule out cellulitis.  We will give him prescription for burst of steroids and have him take Benadryl for the itching and inflammation however we will give him a course of antibiotics.  We agreed together to hold on imaging and labs at this time given the lack of systemic symptoms and otherwise well appearance.  Jose Branch agrees.  He understands return precautions and follow-up instructions.  3:52 PM Just had a conversation with Jose Branch and mother and they completely agree.  Thinners and return precautions and treatment plan.  They had no other questions or concerns and Jose Branch was discharged after getting the medications.         Final Clinical Impression(s) / ED Diagnoses Final diagnoses:  Cellulitis of lower extremity, unspecified laterality  Leg erythema    Rx / DC Orders ED Discharge Orders          Ordered    predniSONE (DELTASONE) 50 MG tablet  Daily with breakfast        10/05/22 1554    cephALEXin (KEFLEX) 500 MG capsule  2 times daily         10/05/22 1554            Clinical Impression: 1. Cellulitis of lower extremity, unspecified laterality   2. Leg erythema     Disposition: Discharge  Condition: Good  I have discussed the results, Dx and Tx plan with the pt(& family if present). He/she/they expressed understanding and agree(s) with the plan. Discharge instructions discussed at great length. Strict return precautions discussed and pt &/or family have verbalized understanding of the instructions. No further questions at time of discharge.    New Prescriptions   CEPHALEXIN (KEFLEX) 500 MG CAPSULE    Take 1 capsule (500 mg total) by mouth 2 (two) times daily for 14 days.   PREDNISONE (DELTASONE) 50 MG TABLET    Take 1 tablet (50 mg total) by mouth daily with breakfast for 5 days.    Follow Up: Paulino Rily, NP [address] [place] [id number] [facility] [place] [id number] [place] [phone]       Zaheer Wageman, Gwenyth Allegra, MD 10/05/22 1556    Winnifred Dufford, Gwenyth Allegra, MD 10/05/22 [phone]

## 2022-10-05 NOTE — ED Triage Notes (Signed)
Patient brought in for pain, redness and swelling to bilateral legs. Patient presents with redness to bilateral lower legs. Family has pictures since Wednesday to show the progression of redness.

## 2022-10-05 NOTE — Discharge Instructions (Signed)
Your history, exam, evaluation today are consistent with either contact dermatitis from some irritant touching her legs versus early cellulitis causing the pain, redness, and swelling.  We had a shared decision-making conversation together and agreed to hold on extensive lab or imaging testing however, we do want to start you on treatment with steroids for the inflammation and you can also use Benadryl for the burning and itching and then antibiotics to treat possible cellulitis/skin infection.  Please follow-up with your primary doctor.  If any symptoms change or worsen over the next few days and do not improve, please return to the nearest emergency department.

## 2022-11-18 ENCOUNTER — Emergency Department (HOSPITAL_BASED_OUTPATIENT_CLINIC_OR_DEPARTMENT_OTHER): Payer: Medicaid Other

## 2022-11-18 ENCOUNTER — Emergency Department (HOSPITAL_BASED_OUTPATIENT_CLINIC_OR_DEPARTMENT_OTHER)
Admission: EM | Admit: 2022-11-18 | Discharge: 2022-11-18 | Disposition: A | Discharge status: Home / Self Care | Payer: Medicaid Other | Attending: Emergency Medicine | Admitting: Emergency Medicine

## 2022-11-18 ENCOUNTER — Encounter (HOSPITAL_BASED_OUTPATIENT_CLINIC_OR_DEPARTMENT_OTHER): Payer: Self-pay

## 2022-11-18 ENCOUNTER — Other Ambulatory Visit: Payer: Self-pay

## 2022-11-18 DIAGNOSIS — F17210 Nicotine dependence, cigarettes, uncomplicated: Secondary | ICD-10-CM | POA: Diagnosis not present

## 2022-11-18 DIAGNOSIS — Y9389 Activity, other specified: Secondary | ICD-10-CM | POA: Diagnosis not present

## 2022-11-18 DIAGNOSIS — S4351XA Sprain of right acromioclavicular joint, initial encounter: Secondary | ICD-10-CM

## 2022-11-18 DIAGNOSIS — W208XXA Other cause of strike by thrown, projected or falling object, initial encounter: Secondary | ICD-10-CM | POA: Insufficient documentation

## 2022-11-18 DIAGNOSIS — M25511 Pain in right shoulder: Secondary | ICD-10-CM | POA: Diagnosis present

## 2022-11-18 MED ORDER — NAPROXEN 250 MG PO TABS
500.0000 mg | ORAL_TABLET | Freq: Once | ORAL | Status: AC
  Administered 2022-11-18: 500 mg via ORAL
  Filled 2022-11-18: qty 2

## 2022-11-18 MED ORDER — NAPROXEN 375 MG PO TABS: ORAL_TABLET | ORAL | 0 refills | Status: AC

## 2022-11-18 MED ORDER — ACETAMINOPHEN 325 MG PO TABS
650.0000 mg | ORAL_TABLET | Freq: Once | ORAL | Status: AC
  Administered 2022-11-18: 650 mg via ORAL
  Filled 2022-11-18: qty 2

## 2022-11-18 NOTE — ED Provider Notes (Signed)
[facility] Provider Note: [name], MD, FACEP  CSN: [id number] MRN: [id number] ARRIVAL: 11/18/22 at 0209 ROOM: [facility]   CHIEF COMPLAINT  Shoulder Injury   HISTORY OF PRESENT ILLNESS  11/18/22 3:58 AM Jose Branch is a 32 y.o. male who was riding lawnmower about 5 PM yesterday afternoon.  The lawnmower fell over and he fell onto his right shoulder.  He is having pain in his right [facility] joint which she rates as a 10 out of 10.  He has taken ibuprofen with some relief.   Past Medical History:  Diagnosis Date   Arthritis    GERD (gastroesophageal reflux disease)    High cholesterol    Hyperlipemia    Intellectual disability     History reviewed. No pertinent surgical history.  History reviewed. No pertinent family history.  Social History   Tobacco Use   Smoking status: Every Day    Packs/day: 1    Types: Cigarettes, Cigars  Substance Use Topics   Alcohol use: Yes   Drug use: Yes    Types: Marijuana    Prior to Admission medications   Medication Sig Start Date End Date Taking? Authorizing Provider  Cetirizine HCl (ZYRTEC PO) Take by mouth.    [provider]  diphenhydrAMINE (BENADRYL) 25 MG tablet Take 1 tablet (25 mg total) by mouth every 6 (six) hours as needed. 03/05/22   Mardene Sayer, MD  SIMVASTATIN PO Take by mouth.    [provider]    Allergies Patient has no known allergies.   REVIEW OF SYSTEMS  Negative except as noted here or in the History of Present Illness.   PHYSICAL EXAMINATION  Initial Vital Signs Blood pressure (!) 139/103, pulse 68, temperature (!) 97.5 F (36.4 C), temperature source Oral, resp. rate 18, height 5\' 6"  (1.676 m), weight 76.2 kg, SpO2 97 %.  Examination General: Well-developed, well-nourished male in no acute distress; appearance consistent with age of record HENT: normocephalic; atraumatic Eyes: Normal appearance Neck: supple Heart: regular rate and rhythm Lungs: clear  to auscultation bilaterally Abdomen: soft; nondistended; nontender; bowel sounds present Extremities: No deformity; full range of motion; mild tenderness of right AC joint without step-off Neurologic: Somnolent but readily awakened; motor function intact in all extremities and symmetric; no facial droop Skin: Warm and dry   RESULTS  Summary of this visit's results, reviewed and interpreted by myself:   EKG Interpretation  Date/Time:    Ventricular Rate:    PR Interval:    QRS Duration:   QT Interval:    QTC Calculation:   R Axis:     Text Interpretation:         Laboratory Studies: No results found for this or any previous visit (from the past 24 hour(s)). Imaging Studies: DG Shoulder Right  Result Date: 11/18/2022 CLINICAL DATA:  Status post fall. EXAM: RIGHT SHOULDER - 2+ VIEW COMPARISON:  None Available. FINDINGS: There is no evidence of fracture or dislocation. There is no evidence of arthropathy or other focal bone abnormality. Soft tissues are unremarkable. IMPRESSION: Negative. Electronically Signed   By: Aram Candela M.D.   On: 11/18/2022 02:45    ED COURSE and MDM  Nursing notes, initial and subsequent vitals signs, including pulse oximetry, reviewed and interpreted by myself.  Vitals:   11/18/22 0219 11/18/22 0221  BP: (!) 139/103   Pulse: 68   Resp: 18   Temp: (!) 97.5 F (36.4 C)   TempSrc: Oral   SpO2: 97%  Weight:  76.2 kg  Height:  5\' 6"  (1.676 m)   Medications  naproxen (NAPROSYN) tablet 500 mg (has no administration in time range)  acetaminophen (TYLENOL) tablet 650 mg (650 mg Oral Given 11/18/22 0229)   Will place patient in shoulder immobilizer and treat with naproxen.  He was reassured this should heal on its own but will refer to orthopedics should symptoms worsen or persist.   PROCEDURES  Procedures   ED DIAGNOSES     ICD-10-CM   1. Acromioclavicular sprain, right, initial encounter  S43.51XA          Jarion Hawthorne, Jonny Ruiz,  MD 11/18/22 [phone]

## 2022-11-18 NOTE — ED Triage Notes (Signed)
Pt had injury on riding lawn mower and fell on right shoulder.  Pt took Advil PO x 2 at home.
